# Patient Record
Sex: Female | Born: 1975 | Race: White | Hispanic: No | Marital: Married | Smoking: Never smoker
Health system: Southern US, Community
[De-identification: ages and names within clinical notes are randomized; demographics above are authoritative.]

## PROBLEM LIST (undated history)

## (undated) DIAGNOSIS — Z8742 Personal history of other diseases of the female genital tract: Secondary | ICD-10-CM

## (undated) DIAGNOSIS — E669 Obesity, unspecified: Secondary | ICD-10-CM

## (undated) DIAGNOSIS — E66811 Obesity, class 1: Secondary | ICD-10-CM

## (undated) DIAGNOSIS — Z8639 Personal history of other endocrine, nutritional and metabolic disease: Secondary | ICD-10-CM

## (undated) HISTORY — DX: Personal history of other diseases of the female genital tract: Z87.42

## (undated) HISTORY — DX: Obesity, class 1: E66.811

## (undated) HISTORY — DX: Personal history of other endocrine, nutritional and metabolic disease: Z86.39

## (undated) HISTORY — DX: Obesity, unspecified: E66.9

---

## 1998-05-09 ENCOUNTER — Other Ambulatory Visit: Admission: RE | Admit: 1998-05-09 | Discharge: 1998-05-09 | Payer: Self-pay | Admitting: Obstetrics and Gynecology

## 2002-12-23 ENCOUNTER — Emergency Department (HOSPITAL_COMMUNITY): Admission: EM | Admit: 2002-12-23 | Discharge: 2002-12-23 | Payer: Self-pay | Admitting: Emergency Medicine

## 2002-12-30 ENCOUNTER — Encounter: Admission: RE | Admit: 2002-12-30 | Discharge: 2002-12-30 | Payer: Self-pay | Admitting: Family Medicine

## 2002-12-30 ENCOUNTER — Encounter: Payer: Self-pay | Admitting: Family Medicine

## 2007-08-21 ENCOUNTER — Inpatient Hospital Stay (HOSPITAL_COMMUNITY): Admission: AD | Admit: 2007-08-21 | Discharge: 2007-08-21 | Payer: Self-pay | Admitting: *Deleted

## 2007-10-18 ENCOUNTER — Inpatient Hospital Stay (HOSPITAL_COMMUNITY): Admission: RE | Admit: 2007-10-18 | Discharge: 2007-10-20 | Payer: Self-pay | Admitting: Obstetrics and Gynecology

## 2011-03-11 NOTE — H&P (Signed)
NAMELOUIE, Cheyenne Gilbert                ACCOUNT NO.:  [id number]   MEDICAL RECORD NO.:  [id number]          PATIENT TYPE:  INP   LOCATION:  9168                          FACILITY:  [facility]   PHYSICIAN:  Lenoard Aden, M.D.DATE OF BIRTH:  [birth year]   DATE OF ADMISSION:  10/18/2007  DATE OF DISCHARGE:                              HISTORY & PHYSICAL   CHIEF COMPLAINT:  Mild preeclampsia and polyhydramnios, with favorable  cervix, for induction.   She is a 35 year old Caucasian female, G1 P0, at 37-5/7 weeks, with  elevated blood pressures, greater than 140/90, and more than 400 mg of  protein in the urine, and favorable cervix, with polyhydramnios, for  induction.  She reports within 24 hours new onset of occasional  scotomata and headache.   SURGICAL HISTORY:  Noncontributory.   MEDICATIONS:  Prenatal vitamins.   FAMILY HISTORY:  Diabetes.   She is a nonsmoker, nondrinker.   She denies domestic physical violence.   She has a history of wisdom tooth extraction and infertility.   Prenatal course has been complicated by elevated blood pressure and  proteinuria as noted.   Laboratory values have all been within normal limits.   PHYSICAL EXAMINATION:  GENERAL:  She is a well-developed, well-nourished  white female in no acute distress.  HEENT:  Normal.  LUNGS:  Clear.  HEART:  Regular rate and rhythm.  ABDOMEN:  Soft, gravid, nontender.  Estimated fetal weight approximately  7 pounds by ultrasound.  PELVIC:  Cervix is 3 cm, 80%, vertex -1.  EXTREMITIES:  Without clubbing, cyanosis, or edema.  NEUROLOGIC:  Nonfocal.  SKIN:  Intact.   IMPRESSION:  1. A 37-week intrauterine pregnancy.  2. Mild preeclampsia at term, with favorable cervix.  3. Polyhydramnios.   PLAN:  Proceed with induction.  GBS positive.  Will proceed with  penicillin prophylaxis.      Lenoard Aden, M.D.  Electronically Signed     RJT/MEDQ  D:  10/18/2007  T:  10/18/2007  Job:  [id number]

## 2011-08-01 LAB — CBC
HCT: 34.6 — ABNORMAL LOW
Hemoglobin: 10.3 — ABNORMAL LOW
Hemoglobin: 12.2
MCHC: 35
MCHC: 35.3
MCV: 92.2
Platelets: 194
Platelets: 255
RBC: 3.75 — ABNORMAL LOW
RDW: 13.8
RDW: 14.1
WBC: 9.9

## 2011-08-01 LAB — COMPREHENSIVE METABOLIC PANEL
ALT: 20
AST: 23
Albumin: 2.6 — ABNORMAL LOW
Alkaline Phosphatase: 135 — ABNORMAL HIGH
BUN: 5 — ABNORMAL LOW
CO2: 23
Calcium: 9
Chloride: 103
Creatinine, Ser: 0.57
GFR calc Af Amer: >60
GFR calc non Af Amer: >60
Glucose, Bld: 91
Potassium: 3.7
Sodium: 135
Total Bilirubin: 0.3
Total Protein: 6

## 2011-08-01 LAB — URIC ACID: Uric Acid, Serum: 4.2

## 2011-08-01 LAB — RPR: RPR Ser Ql: NONREACTIVE

## 2011-08-01 LAB — LACTATE DEHYDROGENASE: LDH: 118

## 2011-08-06 LAB — URINALYSIS, ROUTINE W REFLEX MICROSCOPIC
Hgb urine dipstick: NEGATIVE
Nitrite: NEGATIVE
Protein, ur: NEGATIVE
Specific Gravity, Urine: 1.01
Urobilinogen, UA: 0.2

## 2011-08-06 LAB — URINE CULTURE

## 2013-01-12 ENCOUNTER — Other Ambulatory Visit: Payer: Self-pay | Admitting: Obstetrics and Gynecology

## 2013-01-12 DIAGNOSIS — N6489 Other specified disorders of breast: Secondary | ICD-10-CM

## 2013-01-21 ENCOUNTER — Other Ambulatory Visit: Payer: Self-pay | Admitting: Obstetrics and Gynecology

## 2013-01-21 ENCOUNTER — Ambulatory Visit
Admission: RE | Admit: 2013-01-21 | Discharge: 2013-01-21 | Disposition: A | Discharge status: Home / Self Care | Payer: BC Managed Care – PPO | Source: Ambulatory Visit | Attending: Obstetrics and Gynecology | Admitting: Obstetrics and Gynecology

## 2013-01-21 DIAGNOSIS — N6489 Other specified disorders of breast: Secondary | ICD-10-CM

## 2013-08-11 ENCOUNTER — Other Ambulatory Visit: Payer: Self-pay | Admitting: Obstetrics and Gynecology

## 2013-08-11 DIAGNOSIS — N6489 Other specified disorders of breast: Secondary | ICD-10-CM

## 2013-08-18 ENCOUNTER — Ambulatory Visit
Admission: RE | Admit: 2013-08-18 | Discharge: 2013-08-18 | Disposition: A | Discharge status: Home / Self Care | Payer: BC Managed Care – PPO | Source: Ambulatory Visit | Attending: Obstetrics and Gynecology | Admitting: Obstetrics and Gynecology

## 2013-08-18 DIAGNOSIS — N6489 Other specified disorders of breast: Secondary | ICD-10-CM

## 2014-11-06 IMAGING — MG MM DIGITAL DIAGNOSTIC UNILAT*R*
2 series · 2 of 2 positions shown · non-contrast
Comparison: Prior exams

CLINICAL DATA: Followup right upper outer quadrant focal asymmetry

EXAM:
DIGITAL DIAGNOSTIC  right MAMMOGRAM with CAD

[R MLO]
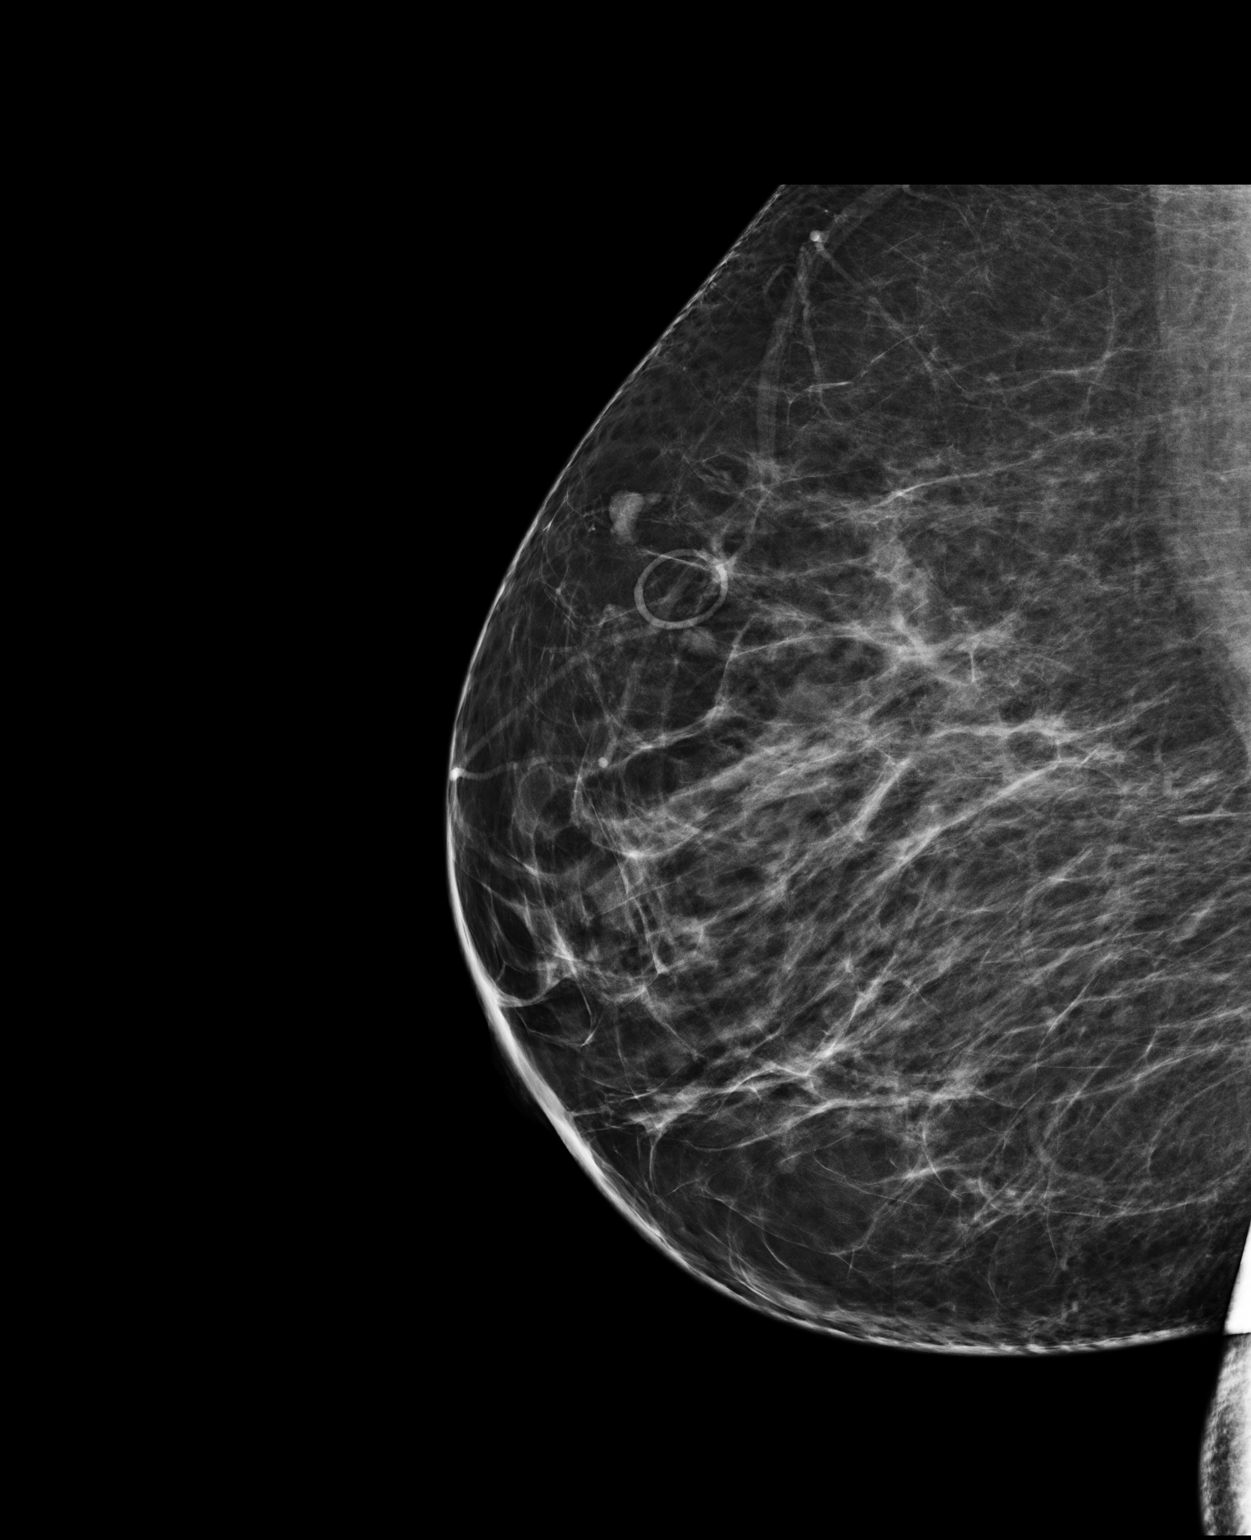

[R CC]
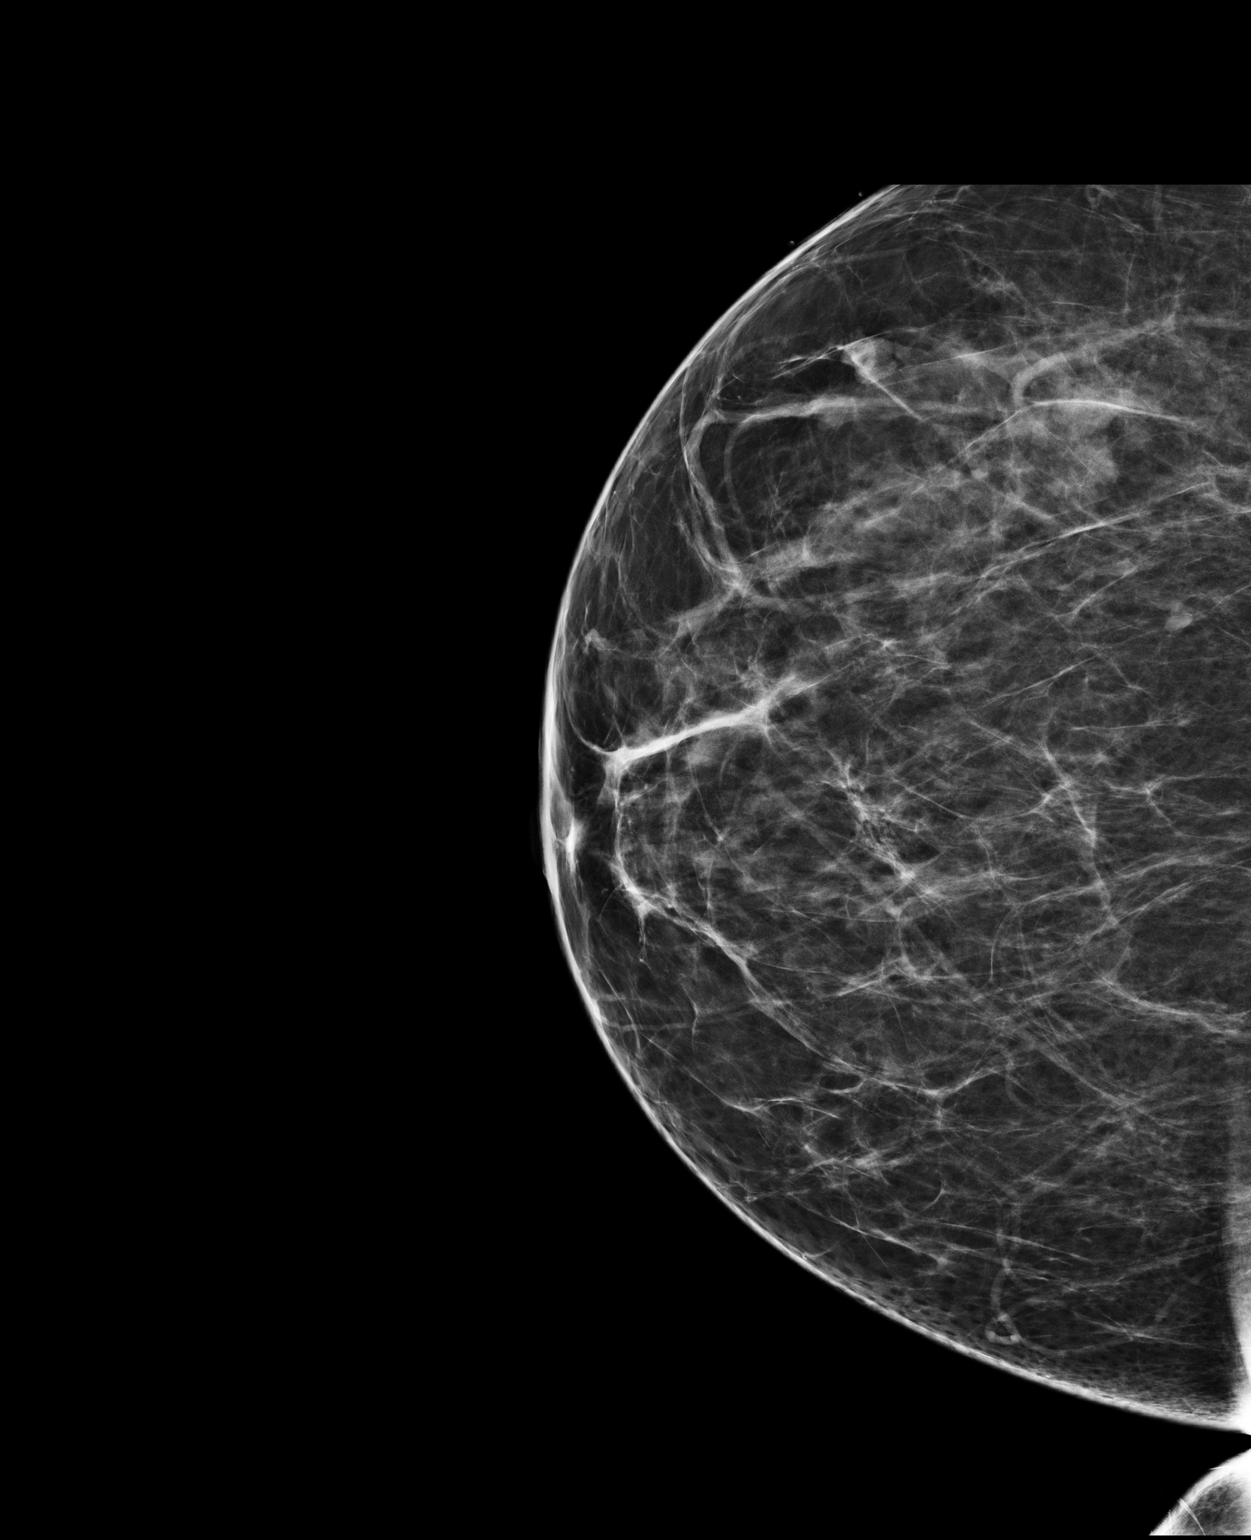

[2 of 2 positions shown; findings below may reference images not displayed]

ACR Breast Density Category b: There are scattered areas of
fibroglandular density.
FINDINGS: No suspicious finding is seen in the right breast. Normal appearing
right upper outer quadrant intramammary lymph nodes are identified.
Nodular breast parenchyma is reidentified in the right upper
quadrant without suspicious feature.

Mammographic images were processed with CAD.
IMPRESSION: No evidence for malignancy in the right breast.

RECOMMENDATION:
Screening mammogram at age 40 unless there are persistent or
intervening clinical concerns. (Code:[id number])

I have discussed the findings and recommendations with the patient.
Results were also provided in writing at the conclusion of the
visit. If applicable, a reminder letter will be sent to the patient
regarding the next appointment.

BI-RADS CATEGORY  1: Negative

## 2016-06-23 ENCOUNTER — Ambulatory Visit: Payer: Self-pay | Admitting: Family Medicine

## 2016-07-07 ENCOUNTER — Other Ambulatory Visit: Payer: Self-pay | Admitting: Family Medicine

## 2016-07-07 ENCOUNTER — Ambulatory Visit (INDEPENDENT_AMBULATORY_CARE_PROVIDER_SITE_OTHER): Payer: BLUE CROSS/BLUE SHIELD | Admitting: Family Medicine

## 2016-07-07 ENCOUNTER — Other Ambulatory Visit: Payer: BLUE CROSS/BLUE SHIELD

## 2016-07-07 ENCOUNTER — Encounter: Payer: Self-pay | Admitting: Family Medicine

## 2016-07-07 VITALS — BP 118/80 | HR 77 | Temp 98.4°F | Ht 64.5 in | Wt 233.6 lb

## 2016-07-07 DIAGNOSIS — I519 Heart disease, unspecified: Principal | ICD-10-CM

## 2016-07-07 DIAGNOSIS — Z8742 Personal history of other diseases of the female genital tract: Secondary | ICD-10-CM

## 2016-07-07 DIAGNOSIS — R946 Abnormal results of thyroid function studies: Secondary | ICD-10-CM

## 2016-07-07 DIAGNOSIS — F32A Depression, unspecified: Secondary | ICD-10-CM | POA: Insufficient documentation

## 2016-07-07 DIAGNOSIS — F329 Major depressive disorder, single episode, unspecified: Secondary | ICD-10-CM | POA: Diagnosis not present

## 2016-07-07 DIAGNOSIS — R7989 Other specified abnormal findings of blood chemistry: Secondary | ICD-10-CM

## 2016-07-07 DIAGNOSIS — Z0001 Encounter for general adult medical examination with abnormal findings: Secondary | ICD-10-CM

## 2016-07-07 DIAGNOSIS — R6889 Other general symptoms and signs: Secondary | ICD-10-CM

## 2016-07-07 DIAGNOSIS — E669 Obesity, unspecified: Secondary | ICD-10-CM | POA: Diagnosis not present

## 2016-07-07 DIAGNOSIS — E039 Hypothyroidism, unspecified: Secondary | ICD-10-CM

## 2016-07-07 LAB — POC URINALSYSI DIPSTICK (AUTOMATED)
Bilirubin, UA: NEGATIVE
Glucose, UA: NEGATIVE
KETONES UA: NEGATIVE
Leukocytes, UA: NEGATIVE
Nitrite, UA: NEGATIVE
PROTEIN UA: NEGATIVE
RBC UA: NEGATIVE
SPEC GRAV UA: 1.025
UROBILINOGEN UA: 0.2
pH, UA: 5

## 2016-07-07 LAB — CBC WITH DIFFERENTIAL/PLATELET
BASOS ABS: 0 10*3/uL (ref 0.0–0.1)
Basophils Relative: 0.4 % (ref 0.0–3.0)
EOS ABS: 0.2 10*3/uL (ref 0.0–0.7)
Eosinophils Relative: 2.4 % (ref 0.0–5.0)
HCT: 39.8 % (ref 36.0–46.0)
Hemoglobin: 13.6 g/dL (ref 12.0–15.0)
LYMPHS ABS: 2.3 10*3/uL (ref 0.7–4.0)
LYMPHS PCT: 33.8 % (ref 12.0–46.0)
MCHC: 34.2 g/dL (ref 30.0–36.0)
MCV: 86 fl (ref 78.0–100.0)
MONOS PCT: 6.4 % (ref 3.0–12.0)
Monocytes Absolute: 0.4 10*3/uL (ref 0.1–1.0)
NEUTROS PCT: 57 % (ref 43.0–77.0)
Neutro Abs: 3.9 10*3/uL (ref 1.4–7.7)
Platelets: 314 10*3/uL (ref 150.0–400.0)
RBC: 4.62 Mil/uL (ref 3.87–5.11)
RDW: 13.3 % (ref 11.5–15.5)
WBC: 6.9 10*3/uL (ref 4.0–10.5)

## 2016-07-07 LAB — HEPATIC FUNCTION PANEL
ALBUMIN: 4.5 g/dL (ref 3.5–5.2)
ALT: 59 U/L — AB (ref 0–35)
AST: 37 U/L (ref 0–37)
Alkaline Phosphatase: 81 U/L (ref 39–117)
Bilirubin, Direct: 0.1 mg/dL (ref 0.0–0.3)
TOTAL PROTEIN: 7 g/dL (ref 6.0–8.3)
Total Bilirubin: 0.4 mg/dL (ref 0.2–1.2)

## 2016-07-07 LAB — TSH: TSH: 5.8 u[IU]/mL — AB (ref 0.35–4.50)

## 2016-07-07 LAB — LIPID PANEL
CHOL/HDL RATIO: 2
Cholesterol: 164 mg/dL (ref 0–200)
HDL: 66.1 mg/dL (ref >39.00)
LDL CALC: 75 mg/dL (ref 0–99)
NonHDL: 97.47
TRIGLYCERIDES: 112 mg/dL (ref 0.0–149.0)
VLDL: 22.4 mg/dL (ref 0.0–40.0)

## 2016-07-07 LAB — BASIC METABOLIC PANEL
BUN: 12 mg/dL (ref 6–23)
CHLORIDE: 105 meq/L (ref 96–112)
CO2: 28 meq/L (ref 19–32)
CREATININE: 0.78 mg/dL (ref 0.40–1.20)
Calcium: 9 mg/dL (ref 8.4–10.5)
GFR: 86.9 mL/min (ref >60.00)
Glucose, Bld: 94 mg/dL (ref 70–99)
POTASSIUM: 4 meq/L (ref 3.5–5.1)
Sodium: 139 mEq/L (ref 135–145)

## 2016-07-07 MED ORDER — SERTRALINE HCL 50 MG PO TABS: 50.0000 mg | ORAL_TABLET | Freq: Every day | ORAL | 3 refills | Status: DC

## 2016-07-07 NOTE — Progress Notes (Signed)
..  lb

## 2016-07-07 NOTE — Progress Notes (Signed)
Patient ID: Cheyenne Gilbert, female   DOB: 1975/12/19, 40 y.o.   MRN: [id number]  Patient presents to clinic today to establish care and is new to the clinic.  Ms. Cheyenne Gilbert is a 40 year old female who is a nonsmoker and is here to to establish and seek routine care. She is concerned about her weight and reports a history of PCOS and irregular menstrual cycles. She is followed by gynecology for this care and is UTD. She reports a trial of Metformin related to PCOS many years ago however states that adverse effect of nausea was persistent and she discontinued use of this medication.  She is currently not using OCP and is not seeking pregnancy.   Diet is not monitored and she reports eating fast foods and is concerned about improving her diet. She is motivated to begin a heart healthy diet and increase her exercise regimen for weight loss. She reports a history of phentermine use which provided excellent benefit but is not interested in medication for weight loss at this time. Exercise is noted as walking on the treadmill for 2 to 3 times/week for 20 minutes without cardiopulmonary symptoms.   Depression is noted today. PHQ-9 noted as a 4. With anhedonia reported for 3 months. Symptoms are also associated with body image per patient report and PCOS. She denies suicidal ideation and plan and is also interested in counseling. No aggravating or alleviating factors noted and she reports a positive family support system at home.    Chronic Issues: PCOS:  Irregular cycles are noted. Patient reports that she may have only one menstrual cycle in a year and is currently sexually active with one partner and condom use.    Health Maintenance: Dental -- Every 6 months Vision -- Yearly; wears contacts Immunizations -- UTD 2009 per patient report  Colonoscopy --Not needed Mammogram --UTD 2017; followed by gynecology PAP -- UTD 2017; followed by gynecology     Review of Systems  Constitutional: Negative for chills  and weight loss.  HENT: Negative for congestion, ear pain, sore throat and tinnitus.   Eyes: Negative for pain and redness.  Respiratory: Negative for cough, hemoptysis and shortness of breath.   Cardiovascular: Negative for chest pain, palpitations and leg swelling.  Gastrointestinal: Negative for constipation, diarrhea, heartburn, nausea and vomiting.  Genitourinary: Negative for dysuria, frequency, hematuria and urgency.  Musculoskeletal: Negative for back pain, joint pain and myalgias.  Neurological: Negative for dizziness, tingling, weakness and headaches.  Psychiatric/Behavioral: Negative for depression. The patient is not nervous/anxious.        Denies depressed or anxious mood   Past Medical History:  Diagnosis Date  . History of PCOS      Social History   Social History  . Marital status: Married    Spouse name: N/A  . Number of children: N/A  . 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 Smoking status: Never Smoker  . Smokeless tobacco: Never Used  . Alcohol use No  . Drug use: No  . Sexual activity: Yes    Birth control/ protection: Condom   Other Topics Concern  . Not on file   Social History Narrative   Sons 4513 and 40 year old. Adopted 8939 year old from [place] and and 40 year old.      Goes by her middle name:  [name]    No past surgical history on file.  Family  History  Problem Relation Age of Onset  . Hypertension Father     No Known Allergies  No current outpatient prescriptions on file prior to visit.   No current facility-administered medications on file prior to visit.     BP 118/80 (BP Location: Right Arm, Patient Position: Sitting, Cuff Size: Normal)   Pulse 77   Temp 98.4 F (36.9 C) (Oral)   Ht 5' 4.5" (1.638 m)   Wt 233 lb 9.6 oz (106 kg)   LMP 06/05/2016 Comment: PCOS   SpO2 97%   Breastfeeding? Unknown   BMI 39.48 kg/m     Physical  Exam  Constitutional: She is oriented to person, place, and time. She appears well-developed and well-nourished. No distress.  HENT:  Head: Normocephalic and atraumatic.  Right Ear: Tympanic membrane and ear canal normal.  Left Ear: Tympanic membrane and ear canal normal.  Mouth/Throat: Oropharynx is clear and moist.  Eyes: Pupils are equal, round, and reactive to light. No scleral icterus.  Neck: Normal range of motion. No thyromegaly present.  Cardiovascular: Normal rate and regular rhythm.   No murmur heard. Pulmonary/Chest: Effort normal and breath sounds normal. No respiratory distress. He has no wheezes. She has no rales. She exhibits no tenderness.  Abdominal: Soft. Bowel sounds are normal. She exhibits no distension and no mass. There is no tenderness. There is no rebound and no guarding.  Musculoskeletal: She exhibits no edema.  Lymphadenopathy:    She has no cervical adenopathy.  Neurological: She is alert and oriented to person, place, and time. She has normal patellar reflexes. She exhibits normal muscle tone. Coordination normal.  Skin: Skin is warm and dry.  Psychiatric: She has a normal mood and affect. Her behavior is normal. Judgment and thought content normal. She has symptoms of mild depression with PHQ-9:  4. She denies any suicidal ideation or plan. Gynecologic exam deferred; followed by gynecology and visit is UTD. No history of abnormal PAP   Assessment/Plan:  40 y.o. female presenting for annual physical.  Health Maintenance counseling: 1. Anticipatory guidance: Patient counseled regarding regular dental exams, eye exams, wearing seatbelts.  2. Risk factor reduction:  Advised patient of need for regular exercise and diet rich and fruits and vegetables to reduce risk of heart attack and stroke.  3. Immunizations/screenings/ancillary studies: She reports UTD on Tdap which will be due in 2019.  Health Maintenance Due  Topic Date Due  . HIV Screening  06/07/1991   . TETANUS/TDAP  06/07/1995  . PAP SMEAR  06/06/1997  . INFLUENZA VACCINE  05/27/2016   4. Cervical cancer screening- UTD 2017; follows gynecology 5. Breast cancer screening-  and mammogram:  Complete 07/04/2016 6. Colon cancer screening - Not needed at this time 7. Skin cancer screening- Use of sunscreen recommended  1. Encounter for general adult medical examination with abnormal findings Encouraged heart healthy diet and exercise. See anticipatory guidance noted above. - CBC with Differential/Platelet - Basic metabolic panel - Lipid panel - Hepatic function panel - TSH - POCT UA - Glucose/Protein  2. Depression Discussed counseling options with trial of sertraline with patient. She voiced interest in counseling; written information with provider contact numbers given to patient today. Will trial sertraline; follow up; in one month. Advised patient that this medication may cause nausea at first and advised that she take this medication at night if this occurs. She voiced understanding and agreed with plan. - sertraline (ZOLOFT) 50 MG tablet; Take 1 tablet (50 mg total) by mouth daily.  Dispense: 30 tablet; Refill: 3  3. History of polycystic ovarian syndrome Follow up with gynecology as recommended.  Will review lab results.  Patient reports consideration of an endocrinology referral that she is discussing with her gynecologist.   5. Obesity Encouraged heart healthy diet and scheduled exercise program with patient. Reviewed long term health concerns with increased weight. She is motivated to begin dietary modifications and exercise.  Will consider referral to nutrition in the future.  Will follow up with influenza vaccine at next visit.  Roddie Mc, FNP-C

## 2016-07-07 NOTE — Patient Instructions (Signed)
It was a pleasure meeting you today! Please follow up in one month for evaluation of new medication.  We have ordered labs or studies at this visit. It can take up to 1-2 weeks for results and processing. IF results require follow up or explanation, we will call you with instructions. Clinically stable results will be released to your [facility]. If you have not heard from [place] or cannot find your results in [facility] in 2 weeks please contact our office at [phone].  If you are not yet signed up for [facility], please consider signing up   Health Maintenance, Female Adopting a healthy lifestyle and getting preventive care can go a long way to promote health and wellness. Talk with your health care provider about what schedule of regular examinations is right for you. This is a good chance for you to check in with your provider about disease prevention and staying healthy. In between checkups, there are plenty of things you can do on your own. Experts have done a lot of research about which lifestyle changes and preventive measures are most likely to keep you healthy. Ask your health care provider for more information. WEIGHT AND DIET  Eat a healthy diet  Be sure to include plenty of vegetables, fruits, low-fat dairy products, and lean protein.  Do not eat a lot of foods high in solid fats, added sugars, or salt.  Get regular exercise. This is one of the most important things you can do for your health.  Most adults should exercise for at least 150 minutes each week. The exercise should increase your heart rate and make you sweat (moderate-intensity exercise).  Most adults should also do strengthening exercises at least twice a week. This is in addition to the moderate-intensity exercise.  Maintain a healthy weight  Body mass index (BMI) is a measurement that can be used to identify possible weight problems. It estimates body fat based on height and weight. Your health care provider can help  determine your BMI and help you achieve or maintain a healthy weight.  For females 11 years of age and older:   A BMI below 18.5 is considered underweight.  A BMI of 18.5 to 24.9 is normal.  A BMI of 25 to 29.9 is considered overweight.  A BMI of 30 and above is considered obese.  Watch levels of cholesterol and blood lipids  You should start having your blood tested for lipids and cholesterol at 40 years of age, then have this test every 5 years.  You may need to have your cholesterol levels checked more often if:  Your lipid or cholesterol levels are high.  You are older than 40 years of age.  You are at high risk for heart disease.  CANCER SCREENING   Lung Cancer  Lung cancer screening is recommended for adults 5-70 years old who are at high risk for lung cancer because of a history of smoking.  A yearly low-dose CT scan of the lungs is recommended for people who:  Currently smoke.  Have quit within the past 15 years.  Have at least a 30-pack-year history of smoking. A pack year is smoking an average of one pack of cigarettes a day for 1 year.  Yearly screening should continue until it has been 15 years since you quit.  Yearly screening should stop if you develop a health problem that would prevent you from having lung cancer treatment.  Breast Cancer  Practice breast self-awareness. This means understanding how your breasts  normally appear and feel.  It also means doing regular breast self-exams. Let your health care provider know about any changes, no matter how small.  If you are in your 20s or 30s, you should have a clinical breast exam (CBE) by a health care provider every 1-3 years as part of a regular health exam.  If you are 70 or older, have a CBE every year. Also consider having a breast X-ray (mammogram) every year.  If you have a family history of breast cancer, talk to your health care provider about genetic screening.  If you are at high risk  for breast cancer, talk to your health care provider about having an MRI and a mammogram every year.  Breast cancer gene (BRCA) assessment is recommended for women who have family members with BRCA-related cancers. BRCA-related cancers include:  Breast.  Ovarian.  Tubal.  Peritoneal cancers.  Results of the assessment will determine the need for genetic counseling and BRCA1 and BRCA2 testing. Cervical Cancer Your health care provider may recommend that you be screened regularly for cancer of the pelvic organs (ovaries, uterus, and vagina). This screening involves a pelvic examination, including checking for microscopic changes to the surface of your cervix (Pap test). You may be encouraged to have this screening done every 3 years, beginning at age 71.  For women ages 37-65, health care providers may recommend pelvic exams and Pap testing every 3 years, or they may recommend the Pap and pelvic exam, combined with testing for human papilloma virus (HPV), every 5 years. Some types of HPV increase your risk of cervical cancer. Testing for HPV may also be done on women of any age with unclear Pap test results.  Other health care providers may not recommend any screening for nonpregnant women who are considered low risk for pelvic cancer and who do not have symptoms. Ask your health care provider if a screening pelvic exam is right for you.  If you have had past treatment for cervical cancer or a condition that could lead to cancer, you need Pap tests and screening for cancer for at least 20 years after your treatment. If Pap tests have been discontinued, your risk factors (such as having a new sexual partner) need to be reassessed to determine if screening should resume. Some women have medical problems that increase the chance of getting cervical cancer. In these cases, your health care provider may recommend more frequent screening and Pap tests. Colorectal Cancer  This type of cancer can be  detected and often prevented.  Routine colorectal cancer screening usually begins at 40 years of age and continues through 40 years of age.  Your health care provider may recommend screening at an earlier age if you have risk factors for colon cancer.  Your health care provider may also recommend using home test kits to check for hidden blood in the stool.  A small camera at the end of a tube can be used to examine your colon directly (sigmoidoscopy or colonoscopy). This is done to check for the earliest forms of colorectal cancer.  Routine screening usually begins at age 80.  Direct examination of the colon should be repeated every 5-10 years through 40 years of age. However, you may need to be screened more often if early forms of precancerous polyps or small growths are found. Skin Cancer  Check your skin from head to toe regularly.  Tell your health care provider about any new moles or changes in moles, especially if there  is a change in a mole's shape or color.  Also tell your health care provider if you have a mole that is larger than the size of a pencil eraser.  Always use sunscreen. Apply sunscreen liberally and repeatedly throughout the day.  Protect yourself by wearing long sleeves, pants, a wide-brimmed hat, and sunglasses whenever you are outside. HEART DISEASE, DIABETES, AND HIGH BLOOD PRESSURE   High blood pressure causes heart disease and increases the risk of stroke. High blood pressure is more likely to develop in:  People who have blood pressure in the high end of the normal range (130-139/85-89 mm Hg).  People who are overweight or obese.  People who are African American.  If you are 2-71 years of age, have your blood pressure checked every 3-5 years. If you are 43 years of age or older, have your blood pressure checked every year. You should have your blood pressure measured twice--once when you are at a hospital or clinic, and once when you are not at a  hospital or clinic. Record the average of the two measurements. To check your blood pressure when you are not at a hospital or clinic, you can use:  An automated blood pressure machine at a pharmacy.  A home blood pressure monitor.  If you are between 80 years and 57 years old, ask your health care provider if you should take aspirin to prevent strokes.  Have regular diabetes screenings. This involves taking a blood sample to check your fasting blood sugar level.  If you are at a normal weight and have a low risk for diabetes, have this test once every three years after 40 years of age.  If you are overweight and have a high risk for diabetes, consider being tested at a younger age or more often. PREVENTING INFECTION  Hepatitis B  If you have a higher risk for hepatitis B, you should be screened for this virus. You are considered at high risk for hepatitis B if:  You were born in a country where hepatitis B is common. Ask your health care provider which countries are considered high risk.  Your parents were born in a high-risk country, and you have not been immunized against hepatitis B (hepatitis B vaccine).  You have HIV or AIDS.  You use needles to inject street drugs.  You live with someone who has hepatitis B.  You have had sex with someone who has hepatitis B.  You get hemodialysis treatment.  You take certain medicines for conditions, including cancer, organ transplantation, and autoimmune conditions. Hepatitis C  Blood testing is recommended for:  Everyone born from [birth year] through 1965.  Anyone with known risk factors for hepatitis C. Sexually transmitted infections (STIs)  You should be screened for sexually transmitted infections (STIs) including gonorrhea and chlamydia if:  You are sexually active and are younger than 40 years of age.  You are older than 40 years of age and your health care provider tells you that you are at risk for this type of  infection.  Your sexual activity has changed since you were last screened and you are at an increased risk for chlamydia or gonorrhea. Ask your health care provider if you are at risk.  If you do not have HIV, but are at risk, it may be recommended that you take a prescription medicine daily to prevent HIV infection. This is called pre-exposure prophylaxis (PrEP). You are considered at risk if:  You are sexually active and do not  regularly use condoms or know the HIV status of your partner(s).  You take drugs by injection.  You are sexually active with a partner who has HIV. Talk with your health care provider about whether you are at high risk of being infected with HIV. If you choose to begin PrEP, you should first be tested for HIV. You should then be tested every 3 months for as long as you are taking PrEP.  PREGNANCY   If you are premenopausal and you may become pregnant, ask your health care provider about preconception counseling.  If you may become pregnant, take 400 to 800 micrograms (mcg) of folic acid every day.  If you want to prevent pregnancy, talk to your health care provider about birth control (contraception). OSTEOPOROSIS AND MENOPAUSE   Osteoporosis is a disease in which the bones lose minerals and strength with aging. This can result in serious bone fractures. Your risk for osteoporosis can be identified using a bone density scan.  If you are 23 years of age or older, or if you are at risk for osteoporosis and fractures, ask your health care provider if you should be screened.  Ask your health care provider whether you should take a calcium or vitamin D supplement to lower your risk for osteoporosis.  Menopause may have certain physical symptoms and risks.  Hormone replacement therapy may reduce some of these symptoms and risks. Talk to your health care provider about whether hormone replacement therapy is right for you.  HOME CARE INSTRUCTIONS   Schedule regular  health, dental, and eye exams.  Stay current with your immunizations.   Do not use any tobacco products including cigarettes, chewing tobacco, or electronic cigarettes.  If you are pregnant, do not drink alcohol.  If you are breastfeeding, limit how much and how often you drink alcohol.  Limit alcohol intake to no more than 1 drink per day for nonpregnant women. One drink equals 12 ounces of beer, 5 ounces of wine, or 1 ounces of hard liquor.  Do not use street drugs.  Do not share needles.  Ask your health care provider for help if you need support or information about quitting drugs.  Tell your health care provider if you often feel depressed.  Tell your health care provider if you have ever been abused or do not feel safe at home.   This information is not intended to replace advice given to you by your health care provider. Make sure you discuss any questions you have with your health care provider.   Document Released: 04/28/2011 Document Revised: 11/03/2014 Document Reviewed: 09/14/2013 Elsevier Interactive Patient Education [employer].

## 2016-07-09 NOTE — Addendum Note (Signed)
Addended by: Bonnye FavaKWEI, NANA K on: 07/09/2016 02:55 PM   Modules accepted: Orders

## 2016-07-10 ENCOUNTER — Other Ambulatory Visit (INDEPENDENT_AMBULATORY_CARE_PROVIDER_SITE_OTHER): Payer: BLUE CROSS/BLUE SHIELD

## 2016-07-10 DIAGNOSIS — R7989 Other specified abnormal findings of blood chemistry: Secondary | ICD-10-CM

## 2016-07-10 DIAGNOSIS — R946 Abnormal results of thyroid function studies: Secondary | ICD-10-CM

## 2016-07-10 LAB — T4, FREE: Free T4: 0.79 ng/dL (ref 0.60–1.60)

## 2016-07-10 NOTE — Addendum Note (Signed)
Addended by: Bonnye FavaKWEI, NANA K on: 07/10/2016 09:20 AM   Modules accepted: Orders

## 2016-07-11 ENCOUNTER — Encounter: Payer: Self-pay | Admitting: Family Medicine

## 2016-08-07 ENCOUNTER — Ambulatory Visit: Payer: BLUE CROSS/BLUE SHIELD | Admitting: Family Medicine

## 2016-10-02 DIAGNOSIS — E039 Hypothyroidism, unspecified: Secondary | ICD-10-CM | POA: Diagnosis not present

## 2017-06-11 ENCOUNTER — Encounter: Payer: Self-pay | Admitting: Family Medicine

## 2017-06-11 ENCOUNTER — Ambulatory Visit (INDEPENDENT_AMBULATORY_CARE_PROVIDER_SITE_OTHER): Payer: BLUE CROSS/BLUE SHIELD | Admitting: Family Medicine

## 2017-06-11 VITALS — BP 100/64 | HR 90 | Temp 98.4°F | Ht 64.5 in | Wt 219.5 lb

## 2017-06-11 DIAGNOSIS — M9904 Segmental and somatic dysfunction of sacral region: Secondary | ICD-10-CM

## 2017-06-11 DIAGNOSIS — M9903 Segmental and somatic dysfunction of lumbar region: Secondary | ICD-10-CM | POA: Diagnosis not present

## 2017-06-11 DIAGNOSIS — M545 Low back pain, unspecified: Secondary | ICD-10-CM

## 2017-06-11 MED ORDER — CYCLOBENZAPRINE HCL 5 MG PO TABS: 5.0000 mg | ORAL_TABLET | Freq: Every evening | ORAL | 0 refills | Status: DC | PRN

## 2017-06-11 MED ORDER — NAPROXEN 500 MG PO TABS: 500.0000 mg | ORAL_TABLET | Freq: Two times a day (BID) | ORAL | 0 refills | Status: DC | PRN

## 2017-06-11 NOTE — Patient Instructions (Signed)
BEFORE YOU LEAVE: -follow up: 1 month -low back exercises  Do the exercises 4 days per week.  Heat (rice sock), tiger balm (menthol) and naproxen as needed for pain.  Muscle relaxer at night if needed.  I hope you are feeling better soon! Follow up sooner if worsening or new concerns.

## 2017-06-11 NOTE — Progress Notes (Signed)
HPI:  Acute visit for low back pain: -Started suddenly 2 days ago -She did run on some roller coasters a few days prior -Pain is in the low back, is sharp with certain movements, dull and constant otherwise -Was worse after sitting on the bleachers for several hours -Denies: Radiation of pain, weakness, numbness, fevers, malaise, bowel or bladder dysfunction -History of tailbone issues, but denies history of back pains otherwise -First day last menstrual period at least several months ago, reports history of PCO S and rarely has periods  ROS: See pertinent positives and negatives per HPI.  Past Medical History:  Diagnosis Date  . History of PCOS     No past surgical history on file.  Family History  Problem Relation Age of Onset  . Hypertension Father     Social History   Social History  . Marital status: Married    Spouse name: N/A  . Number of children: N/A  . 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 Smoking status: Never Smoker  . Smokeless tobacco: Never Used  . Alcohol use No  . Drug use: No  . Sexual activity: Yes    Birth control/ protection: Condom   Other Topics Concern  . None   Social History Narrative   Sons 3813 and 41 year old. Adopted 41 year old from [place] and and 41 year old.      Goes by her middle name:  Cheyenne Gilbert     Current Outpatient Prescriptions:  .  SYNTHROID 50 MCG tablet, , Disp: , Rfl:  .  cyclobenzaprine (FLEXERIL) 5 MG tablet, Take 1 tablet (5 mg total) by mouth at bedtime as needed for muscle spasms., Disp: 20 tablet, Rfl: 0 .  naproxen (NAPROSYN) 500 MG tablet, Take 1 tablet (500 mg total) by mouth 2 (two) times daily as needed for moderate pain., Disp: 20 tablet, Rfl: 0  EXAM:  Vitals:   06/11/17 1553  BP: 100/64  Pulse: 90  Temp: 98.4 F (36.9 C)    Body mass index is 37.1 kg/m.  GENERAL: vitals reviewed and listed above, alert, oriented,  appears well hydrated and in no acute distress  HEENT: atraumatic, conjunttiva clear, no obvious abnormalities on inspection of external nose and ears  NECK: no obvious masses on inspection  LUNGS: clear to auscultation bilaterally, no wheezes, rales or rhonchi, good air movement  CV: HRRR, no peripheral edema  MS/Osteopathic: moves all extremities without noticeable abnormality Normal Gait Normal inspection of back, no obvious scoliosis or leg length descrepancy No bony TTP Soft tissue TTP at: Right lumbar paraspinal muscles around L3-L5, left lower lumbar paraspinal muscles and over pedicles of L4 She has muscle tension bilaterally in the lower lumbar region, L1-3  rotated right side bent left; L4 extended rotated left side bent left Right innominate anterior Tight hamstrings -/+ tests: neg trendelenburg,+facet loading L L4, -SLRT, -CLRT, -FABER, -FADIR Normal muscle strength, sensation to light touch and DTRs in LEs bilaterally  PSYCH: pleasant and cooperative, no obvious depression or anxiety  ASSESSMENT AND PLAN:  Discussed the following assessment and plan:  Acute low back pain without sciatica, unspecified back pain laterality  Somatic dysfunction of spine, sacral  Somatic dysfunction of spine, lumbar  -we discussed possible serious and likely etiologies, workup and treatment, treatment risks and return precautions - she does have somatic dysfunction on exam, also suspect muscle strain and facet arthropathy most likely -  No significant alarm features or focal neuro deficits -after this discussion, Cheyenne Gilbert opted for osteopathic medical treatment with ME, BMT, verbal consent obtained, tolerated well, home exercises, symptomatic care with heat, topical menthol, over-the-counter analgesics and Flexeril as needed after discussion of risks -follow up advised in 3-4 weeks -of course, we advised Cheyenne Gilbert  to return or notify a doctor immediately if symptoms worsen or persist or new  concerns arise.   Patient Instructions  BEFORE YOU LEAVE: -follow up: 1 month -low back exercises  Do the exercises 4 days per week.  Heat (rice sock), tiger balm (menthol) and naproxen as needed for pain.  Muscle relaxer at night if needed.  I hope you are feeling better soon! Follow up sooner if worsening or new concerns.     Cheyenne Basque R., DO

## 2017-06-15 ENCOUNTER — Encounter: Payer: Self-pay | Admitting: Family Medicine

## 2017-06-16 ENCOUNTER — Encounter: Payer: Self-pay | Admitting: *Deleted

## 2017-06-18 DIAGNOSIS — Z130 Encounter for screening for diseases of the blood and blood-forming organs and certain disorders involving the immune mechanism: Secondary | ICD-10-CM | POA: Diagnosis not present

## 2017-06-18 DIAGNOSIS — E282 Polycystic ovarian syndrome: Secondary | ICD-10-CM | POA: Diagnosis not present

## 2017-06-18 DIAGNOSIS — Z3200 Encounter for pregnancy test, result unknown: Secondary | ICD-10-CM | POA: Diagnosis not present

## 2017-06-18 DIAGNOSIS — N938 Other specified abnormal uterine and vaginal bleeding: Secondary | ICD-10-CM | POA: Diagnosis not present

## 2017-06-18 DIAGNOSIS — N926 Irregular menstruation, unspecified: Secondary | ICD-10-CM | POA: Diagnosis not present

## 2017-06-25 DIAGNOSIS — N938 Other specified abnormal uterine and vaginal bleeding: Secondary | ICD-10-CM | POA: Diagnosis not present

## 2017-06-25 DIAGNOSIS — R938 Abnormal findings on diagnostic imaging of other specified body structures: Secondary | ICD-10-CM | POA: Diagnosis not present

## 2017-06-25 DIAGNOSIS — E282 Polycystic ovarian syndrome: Secondary | ICD-10-CM | POA: Diagnosis not present

## 2017-07-02 DIAGNOSIS — Z30430 Encounter for insertion of intrauterine contraceptive device: Secondary | ICD-10-CM | POA: Diagnosis not present

## 2017-07-02 DIAGNOSIS — N854 Malposition of uterus: Secondary | ICD-10-CM | POA: Diagnosis not present

## 2017-07-16 ENCOUNTER — Encounter: Payer: Self-pay | Admitting: Family Medicine

## 2017-08-13 DIAGNOSIS — Z30431 Encounter for routine checking of intrauterine contraceptive device: Secondary | ICD-10-CM | POA: Diagnosis not present

## 2018-03-25 DIAGNOSIS — D1801 Hemangioma of skin and subcutaneous tissue: Secondary | ICD-10-CM | POA: Diagnosis not present

## 2018-03-25 DIAGNOSIS — D2339 Other benign neoplasm of skin of other parts of face: Secondary | ICD-10-CM | POA: Diagnosis not present

## 2018-11-05 ENCOUNTER — Encounter: Payer: BLUE CROSS/BLUE SHIELD | Admitting: Family Medicine

## 2019-08-22 ENCOUNTER — Other Ambulatory Visit: Payer: Self-pay

## 2019-12-07 DIAGNOSIS — Z1231 Encounter for screening mammogram for malignant neoplasm of breast: Secondary | ICD-10-CM | POA: Diagnosis not present

## 2019-12-08 DIAGNOSIS — L718 Other rosacea: Secondary | ICD-10-CM | POA: Diagnosis not present

## 2019-12-20 DIAGNOSIS — Z6834 Body mass index (BMI) 34.0-34.9, adult: Secondary | ICD-10-CM | POA: Diagnosis not present

## 2019-12-20 DIAGNOSIS — Z1151 Encounter for screening for human papillomavirus (HPV): Secondary | ICD-10-CM | POA: Diagnosis not present

## 2019-12-20 DIAGNOSIS — Z01419 Encounter for gynecological examination (general) (routine) without abnormal findings: Secondary | ICD-10-CM | POA: Diagnosis not present

## 2020-01-18 DIAGNOSIS — L718 Other rosacea: Secondary | ICD-10-CM | POA: Diagnosis not present

## 2020-01-19 DIAGNOSIS — M7062 Trochanteric bursitis, left hip: Secondary | ICD-10-CM | POA: Diagnosis not present

## 2020-01-19 DIAGNOSIS — M25552 Pain in left hip: Secondary | ICD-10-CM | POA: Diagnosis not present

## 2020-02-10 ENCOUNTER — Other Ambulatory Visit: Payer: Self-pay | Admitting: Family Medicine

## 2020-02-10 ENCOUNTER — Ambulatory Visit (INDEPENDENT_AMBULATORY_CARE_PROVIDER_SITE_OTHER): Payer: BLUE CROSS/BLUE SHIELD | Admitting: Family Medicine

## 2020-02-10 ENCOUNTER — Encounter: Payer: Self-pay | Admitting: Family Medicine

## 2020-02-10 DIAGNOSIS — Z13220 Encounter for screening for lipoid disorders: Secondary | ICD-10-CM | POA: Diagnosis not present

## 2020-02-10 DIAGNOSIS — R5383 Other fatigue: Secondary | ICD-10-CM

## 2020-02-10 DIAGNOSIS — E039 Hypothyroidism, unspecified: Secondary | ICD-10-CM

## 2020-02-10 DIAGNOSIS — Z131 Encounter for screening for diabetes mellitus: Secondary | ICD-10-CM

## 2020-02-10 MED ORDER — SOOLANTRA 1 % EX CREA: 1.0000 "application " | TOPICAL_CREAM | Freq: Two times a day (BID) | CUTANEOUS | Status: DC

## 2020-02-10 MED ORDER — SULFACETAMIDE SODIUM-SULFUR 8-4 % EX SUSP: 1.0000 "application " | Freq: Every day | CUTANEOUS | Status: AC

## 2020-02-10 NOTE — Progress Notes (Signed)
Virtual Visit via Video Note  I connected with Cheyenne Gilbert on 02/10/20 at  8:30 AM EDT by a video enabled telemedicine application and verified that I am speaking with the correct person using two identifiers.  Location patient: home Location provider:work office Persons participating in the virtual visit: patient, provider  I discussed the limitations of evaluation and management by telemedicine and the availability of in person appointments. The patient expressed understanding and agreed to proceed.   Cheyenne Gilbert DOB: 22-Oct-1976 Encounter date: 02/10/2020  This is a 44 y.o. female who presents to establish care. Chief Complaint  Patient presents with  . Establish Care    History of present illness: Not great with keeping up with regular visits.   Gyn:dr. Ronita Hipps Dermatologist: [facility]; was seeing them for rosacea. Doxycycline made her sick. She is now just using topical treatments. Gets annual screening through [facility] yearly. (gets finger stick)  Had mammogram in last couple of months.   Was put on low dose of thyroid med a few years ago and just kind of stopped taking this. Was working really hard at weight loss and got down to 193, and lost weight during COVID, but has gained back a lot in last few months. Feels like she is gaining more than she should for what she is eating.   PCOS: went through fertility tx in 2004. Couldn't even do in vitro due to pcos. 4 years later got pregnant without trying. Periods have never been normal.   Past Medical History:  Diagnosis Date  . History of PCOS    History reviewed. No pertinent surgical history. No Known Allergies Current Meds  Medication Sig  . levonorgestrel (MIRENA) 20 MCG/24HR IUD 1 each by Intrauterine route once. Inserted 05/2017   Social History   Tobacco Use  . Smoking status: Never Smoker  . Smokeless tobacco: Never Used  Substance Use Topics  . Alcohol use: Yes    Comment: occasionally   Family History   Problem Relation Age of Onset  . Healthy Mother   . Hypertension Father   . Other Father        borderline diabetes  . Obesity Brother   . Stomach cancer Maternal Grandmother   . Cirrhosis Maternal Grandfather   . Alcohol abuse Maternal Grandfather   . Stroke Paternal Grandfather 65  . Diabetes Paternal Grandfather   . Cirrhosis Maternal Aunt        no alcohol  . Diabetes Paternal Uncle      Review of Systems  Constitutional: Positive for fatigue. Negative for chills and fever.  Respiratory: Negative for cough, chest tightness, shortness of breath and wheezing.   Cardiovascular: Negative for chest pain, palpitations and leg swelling.    Objective:  There were no vitals taken for this visit.      BP Readings from Last 3 Encounters:  06/11/17 100/64  07/07/16 118/80   Wt Readings from Last 3 Encounters:  06/11/17 219 lb 8 oz (99.6 kg)  07/07/16 233 lb 9.6 oz (106 kg)    EXAM:  GENERAL: alert, oriented, appears well and in no acute distress  HEENT: atraumatic, conjunctiva clear, no obvious abnormalities on inspection of external nose and ears  NECK: normal movements of the head and neck  LUNGS: on inspection no signs of respiratory distress, breathing rate appears normal, no obvious gross SOB, gasping or wheezing  CV: no obvious cyanosis  MS: moves all visible extremities without noticeable abnormality  PSYCH/NEURO: pleasant and cooperative, no obvious depression  or anxiety, speech and thought processing grossly intact   Assessment/Plan  1. Lipid screening  - Lipid panel; Future  2. Screening for diabetes mellitus  - Comprehensive metabolic panel; Future - Hemoglobin A1c; Future  3. Hypothyroidism, unspecified type Previously on medication; will recheck labs due to fatigue, weight gain - TSH; Future - T4, free; Future - T3, free; Future  4. Fatigue, unspecified type - CBC with Differential/Platelet; Future - Comprehensive metabolic panel;  Future - VITAMIN D 25 Hydroxy (Vit-D Deficiency, Fractures); Future - Vitamin B12; Future   Return for bloodwork.   I discussed the assessment and treatment plan with the patient. The patient was provided an opportunity to ask questions and all were answered. The patient agreed with the plan and demonstrated an understanding of the instructions.   The patient was advised to call back or seek an in-person evaluation if the symptoms worsen or if the condition fails to improve as anticipated.  I provided 30 minutes of non-face-to-face time during this encounter.   Theodis Shove, MD

## 2020-02-14 ENCOUNTER — Telehealth: Payer: Self-pay | Admitting: *Deleted

## 2020-02-14 NOTE — Telephone Encounter (Signed)
Spoke with the pt and scheduled appts as below.  

## 2020-02-14 NOTE — Telephone Encounter (Signed)
-----   Message from Wynn Banker, MD sent at 02/10/2020  9:02 AM EDT ----- Please set up lab visit and she would like to get Tdap booster at same time (nurse visit)

## 2020-02-21 ENCOUNTER — Other Ambulatory Visit: Payer: Self-pay

## 2020-02-22 ENCOUNTER — Other Ambulatory Visit (INDEPENDENT_AMBULATORY_CARE_PROVIDER_SITE_OTHER): Payer: BC Managed Care – PPO

## 2020-02-22 ENCOUNTER — Ambulatory Visit (INDEPENDENT_AMBULATORY_CARE_PROVIDER_SITE_OTHER): Payer: BC Managed Care – PPO

## 2020-02-22 DIAGNOSIS — Z13220 Encounter for screening for lipoid disorders: Secondary | ICD-10-CM

## 2020-02-22 DIAGNOSIS — Z131 Encounter for screening for diabetes mellitus: Secondary | ICD-10-CM

## 2020-02-22 DIAGNOSIS — Z23 Encounter for immunization: Secondary | ICD-10-CM | POA: Diagnosis not present

## 2020-02-22 DIAGNOSIS — E039 Hypothyroidism, unspecified: Secondary | ICD-10-CM | POA: Diagnosis not present

## 2020-02-22 DIAGNOSIS — R5383 Other fatigue: Secondary | ICD-10-CM | POA: Diagnosis not present

## 2020-02-22 LAB — COMPREHENSIVE METABOLIC PANEL
ALT: 22 U/L (ref 0–35)
AST: 16 U/L (ref 0–37)
Albumin: 4.4 g/dL (ref 3.5–5.2)
Alkaline Phosphatase: 64 U/L (ref 39–117)
BUN: 13 mg/dL (ref 6–23)
CO2: 26 mEq/L (ref 19–32)
Calcium: 9.2 mg/dL (ref 8.4–10.5)
Chloride: 106 mEq/L (ref 96–112)
Creatinine, Ser: 0.68 mg/dL (ref 0.40–1.20)
GFR: 94.12 mL/min (ref >60.00)
Glucose, Bld: 97 mg/dL (ref 70–99)
Potassium: 4 mEq/L (ref 3.5–5.1)
Sodium: 140 mEq/L (ref 135–145)
Total Bilirubin: 0.5 mg/dL (ref 0.2–1.2)
Total Protein: 6.4 g/dL (ref 6.0–8.3)

## 2020-02-22 LAB — T3, FREE: T3, Free: 3.5 pg/mL (ref 2.3–4.2)

## 2020-02-22 LAB — CBC WITH DIFFERENTIAL/PLATELET
Basophils Absolute: 0.1 10*3/uL (ref 0.0–0.1)
Basophils Relative: 1.2 % (ref 0.0–3.0)
Eosinophils Absolute: 0.2 10*3/uL (ref 0.0–0.7)
Eosinophils Relative: 2.8 % (ref 0.0–5.0)
HCT: 38.6 % (ref 36.0–46.0)
Hemoglobin: 13.2 g/dL (ref 12.0–15.0)
Lymphocytes Relative: 25 % (ref 12.0–46.0)
Lymphs Abs: 1.7 10*3/uL (ref 0.7–4.0)
MCHC: 34.1 g/dL (ref 30.0–36.0)
MCV: 87.7 fl (ref 78.0–100.0)
Monocytes Absolute: 0.4 10*3/uL (ref 0.1–1.0)
Monocytes Relative: 6.2 % (ref 3.0–12.0)
Neutro Abs: 4.3 10*3/uL (ref 1.4–7.7)
Neutrophils Relative %: 64.8 % (ref 43.0–77.0)
Platelets: 305 10*3/uL (ref 150.0–400.0)
RBC: 4.4 Mil/uL (ref 3.87–5.11)
RDW: 12.7 % (ref 11.5–15.5)
WBC: 6.6 10*3/uL (ref 4.0–10.5)

## 2020-02-22 LAB — LIPID PANEL
Cholesterol: 157 mg/dL (ref 0–200)
HDL: 57.3 mg/dL (ref >39.00)
LDL Cholesterol: 82 mg/dL (ref 0–99)
NonHDL: 100.03
Total CHOL/HDL Ratio: 3
Triglycerides: 92 mg/dL (ref 0.0–149.0)
VLDL: 18.4 mg/dL (ref 0.0–40.0)

## 2020-02-22 LAB — T4, FREE: Free T4: 0.72 ng/dL (ref 0.60–1.60)

## 2020-02-22 LAB — TSH: TSH: 3.09 u[IU]/mL (ref 0.35–4.50)

## 2020-02-22 LAB — HEMOGLOBIN A1C: Hgb A1c MFr Bld: 5.2 % (ref 4.6–6.5)

## 2020-02-22 LAB — VITAMIN B12: Vitamin B-12: 264 pg/mL (ref 211–911)

## 2020-02-22 LAB — VITAMIN D 25 HYDROXY (VIT D DEFICIENCY, FRACTURES): VITD: 33.61 ng/mL (ref 30.00–100.00)

## 2020-02-22 NOTE — Progress Notes (Signed)
Per orders of Dr. Hassan Rowan, injection of Tdap given by Sherrin Daisy. Patient tolerated injection well.

## 2020-06-25 ENCOUNTER — Encounter: Payer: Self-pay | Admitting: Family Medicine

## 2020-06-25 DIAGNOSIS — U071 COVID-19: Secondary | ICD-10-CM | POA: Diagnosis not present

## 2020-06-26 ENCOUNTER — Telehealth (INDEPENDENT_AMBULATORY_CARE_PROVIDER_SITE_OTHER): Payer: BC Managed Care – PPO | Admitting: Family Medicine

## 2020-06-26 ENCOUNTER — Encounter: Payer: Self-pay | Admitting: Family Medicine

## 2020-06-26 VITALS — Temp 99.6°F | Ht 65.0 in | Wt 220.0 lb

## 2020-06-26 DIAGNOSIS — B342 Coronavirus infection, unspecified: Secondary | ICD-10-CM | POA: Diagnosis not present

## 2020-06-26 DIAGNOSIS — R059 Cough, unspecified: Secondary | ICD-10-CM

## 2020-06-26 DIAGNOSIS — E669 Obesity, unspecified: Secondary | ICD-10-CM

## 2020-06-26 DIAGNOSIS — R05 Cough: Secondary | ICD-10-CM

## 2020-06-26 MED ORDER — BENZONATATE 100 MG PO CAPS
100.0000 mg | ORAL_CAPSULE | Freq: Three times a day (TID) | ORAL | 0 refills | Status: DC | PRN
Start: 2020-06-26 — End: 2021-07-15

## 2020-06-26 NOTE — Patient Instructions (Signed)
-  stay home while sick and for a full 10 days since onset of symptoms PLUS one day of no fever and feeling better when you have COVID19  -I sent the medication(s) we discussed to your pharmacy: Meds ordered this encounter  Medications  . benzonatate (TESSALON PERLES) 100 MG capsule    Sig: Take 1 capsule (100 mg total) by mouth 3 (three) times daily as needed.    Dispense:  20 capsule    Refill:  0    -call number provided for outpatient COVID19 treatment center [phone]  -can use tylenol or aleve if needed for fevers, aches and pains per instructions  -can use nasal saline a few times per day if nasal congestion, sometime a short course of Afrin nasal spray for 3 days can help as well  -stay hydrated, drink plenty of fluids and eat small healthy meals - avoid dairy  -can take 1000 IU Vit D3 and Vit C lozenges per instructions  -check out the CDC website for more information  -follow up with your doctor in 2-3 days unless improving and feeling better  I hope you are feeling better soon! Seek in-person care or a follow up telemedicine visit promptly if your symptoms worsen, new concerns arise or you are not improving as expected. Call 911 if severe symptoms.  

## 2020-06-26 NOTE — Telephone Encounter (Signed)
Spoke with the Cheyenne Gilbert and scheduled a virtual appt for today with Dr Selena Batten at 3pm.

## 2020-06-26 NOTE — Progress Notes (Signed)
Virtual Visit via Video Note  I connected with Cheyenne Gilbert  on 06/26/20 at  3:00 PM EDT by a video enabled telemedicine application and verified that I am speaking with the correct person using two identifiers.  Location patient: home, Cheyenne Gilbert Location provider:work or home office Persons participating in the virtual visit: patient, provider  I discussed the limitations of evaluation and management by telemedicine and the availability of in person appointments. The patient expressed understanding and agreed to proceed.   HPI:  Acute visit for Cough/COVID19: -started 8/28 -had positive COVID19 test 06/15/2020 -symptoms: tickle in throat, sinus congestion, cough, chills at times - low grade fever -denies fevers, SOB, NVD, body aches, inability to get out of bed, CP, worst headache or severe headache,  -not vaccinated for covid19 -no known exposures, kids go to school, husband works -has IUD  -denies any underlying health issues  ROS: See pertinent positives and negatives per HPI.  Past Medical History:  Diagnosis Date  . History of PCOS     History reviewed. No pertinent surgical history.  Family History  Problem Relation Age of Onset  . Healthy Mother   . Hypertension Father   . Other Father        borderline diabetes  . Obesity Brother   . Stomach cancer Maternal Grandmother   . Cirrhosis Maternal Grandfather   . Alcohol abuse Maternal Grandfather   . Stroke Paternal Grandfather 58  . Diabetes Paternal Grandfather   . Cirrhosis Maternal Aunt        no alcohol  . Diabetes Paternal Uncle     SOCIAL HX: see hpi   Current Outpatient Medications:  .  Ivermectin (SOOLANTRA) 1 % CREA, Apply 1 application topically 2 (two) times daily., Disp: , Rfl:  .  levonorgestrel (MIRENA) 20 MCG/24HR IUD, 1 each by Intrauterine route once. Inserted 05/2017, Disp: , Rfl:  .  Sulfacetamide Sodium-Sulfur (SULFACLEANSE 8/4) 8-4 % SUSP, Apply 1 application topically daily., Disp: , Rfl:  .   benzonatate (TESSALON PERLES) 100 MG capsule, Take 1 capsule (100 mg total) by mouth 3 (three) times daily as needed., Disp: 20 capsule, Rfl: 0  EXAM:  VITALS per patient if applicable: Vitals:   06/26/20 1506  Temp: 99.6 F (37.6 C)   Body mass index is 36.61 kg/m.   GENERAL: alert, oriented, appears well and in no acute distress  HEENT: atraumatic, conjunttiva clear, no obvious abnormalities on inspection of external nose and ears  NECK: normal movements of the head and neck  LUNGS: on inspection no signs of respiratory distress, breathing rate appears normal, no obvious gross SOB, gasping or wheezing  CV: no obvious cyanosis  MS: moves all visible extremities without noticeable abnormality  PSYCH/NEURO: pleasant and cooperative, no obvious depression or anxiety, speech and thought processing grossly intact  ASSESSMENT AND PLAN:  Discussed the following assessment and plan:  Cough  Coronavirus infection  Obesity (BMI 30-39.9)  -we discussed possible serious and likely etiologies, options for evaluation and workup, limitations of telemedicine visit vs in person visit, treatment, treatment risks and precautions. Pt prefers to treat via telemedicine empirically rather then risking or undertaking an in person visit at this moment. Discussed implications, treatment options, potential complications, risks factors, home isolation and precautions for COVID19 infection. Opted for vit D and C, tessalon rx for cough, prn otc meds for symptoms as needed. Discussed MAB, number provided - she does not feel like she needs this. Agrees to home isolation. Declines scheduled follow up but agrees  to seek care if needed. Work/School slipped offered: declined Advised to seek prompt follow up telemedicine visit or in person care if worsening, new symptoms arise, or if is not improving as expected.   I discussed the assessment and treatment plan with the patient. The patient was provided an  opportunity to ask questions and all were answered. The patient agreed with the plan and demonstrated an understanding of the instructions.   The patient was advised to call back or seek an in-person evaluation if the symptoms worsen or if the condition fails to improve as anticipated.   Terressa Koyanagi, DO

## 2021-06-19 DIAGNOSIS — Z1231 Encounter for screening mammogram for malignant neoplasm of breast: Secondary | ICD-10-CM | POA: Diagnosis not present

## 2021-06-19 DIAGNOSIS — Z6837 Body mass index (BMI) 37.0-37.9, adult: Secondary | ICD-10-CM | POA: Diagnosis not present

## 2021-06-19 DIAGNOSIS — Z30431 Encounter for routine checking of intrauterine contraceptive device: Secondary | ICD-10-CM | POA: Diagnosis not present

## 2021-06-19 DIAGNOSIS — Z01419 Encounter for gynecological examination (general) (routine) without abnormal findings: Secondary | ICD-10-CM | POA: Diagnosis not present

## 2021-07-15 ENCOUNTER — Encounter: Payer: Self-pay | Admitting: Family Medicine

## 2021-07-15 ENCOUNTER — Encounter: Payer: Self-pay | Admitting: Gastroenterology

## 2021-07-15 ENCOUNTER — Other Ambulatory Visit: Payer: Self-pay

## 2021-07-15 ENCOUNTER — Ambulatory Visit (INDEPENDENT_AMBULATORY_CARE_PROVIDER_SITE_OTHER): Payer: BC Managed Care – PPO | Admitting: Family Medicine

## 2021-07-15 VITALS — BP 118/82 | HR 66 | Temp 98.4°F | Ht 64.5 in | Wt 222.6 lb

## 2021-07-15 DIAGNOSIS — Z1211 Encounter for screening for malignant neoplasm of colon: Secondary | ICD-10-CM

## 2021-07-15 DIAGNOSIS — Z0000 Encounter for general adult medical examination without abnormal findings: Secondary | ICD-10-CM | POA: Diagnosis not present

## 2021-07-15 DIAGNOSIS — Z13220 Encounter for screening for lipoid disorders: Secondary | ICD-10-CM

## 2021-07-15 DIAGNOSIS — Z8639 Personal history of other endocrine, nutritional and metabolic disease: Secondary | ICD-10-CM | POA: Diagnosis not present

## 2021-07-15 DIAGNOSIS — Z131 Encounter for screening for diabetes mellitus: Secondary | ICD-10-CM | POA: Diagnosis not present

## 2021-07-15 DIAGNOSIS — Z8742 Personal history of other diseases of the female genital tract: Secondary | ICD-10-CM | POA: Diagnosis not present

## 2021-07-15 LAB — COMPREHENSIVE METABOLIC PANEL
ALT: 23 U/L (ref 0–35)
AST: 17 U/L (ref 0–37)
Albumin: 4.4 g/dL (ref 3.5–5.2)
Alkaline Phosphatase: 63 U/L (ref 39–117)
BUN: 9 mg/dL (ref 6–23)
CO2: 28 mEq/L (ref 19–32)
Calcium: 9.2 mg/dL (ref 8.4–10.5)
Chloride: 105 mEq/L (ref 96–112)
Creatinine, Ser: 0.74 mg/dL (ref 0.40–1.20)
GFR: 97.93 mL/min (ref >60.00)
Glucose, Bld: 80 mg/dL (ref 70–99)
Potassium: 4 mEq/L (ref 3.5–5.1)
Sodium: 139 mEq/L (ref 135–145)
Total Bilirubin: 0.5 mg/dL (ref 0.2–1.2)
Total Protein: 7.1 g/dL (ref 6.0–8.3)

## 2021-07-15 LAB — LIPID PANEL
Cholesterol: 154 mg/dL (ref 0–200)
HDL: 67.1 mg/dL (ref >39.00)
LDL Cholesterol: 63 mg/dL (ref 0–99)
NonHDL: 86.43
Total CHOL/HDL Ratio: 2
Triglycerides: 118 mg/dL (ref 0.0–149.0)
VLDL: 23.6 mg/dL (ref 0.0–40.0)

## 2021-07-15 LAB — CBC WITH DIFFERENTIAL/PLATELET
Basophils Absolute: 0 10*3/uL (ref 0.0–0.1)
Basophils Relative: 0.7 % (ref 0.0–3.0)
Eosinophils Absolute: 0.1 10*3/uL (ref 0.0–0.7)
Eosinophils Relative: 1.5 % (ref 0.0–5.0)
HCT: 37.5 % (ref 36.0–46.0)
Hemoglobin: 12.8 g/dL (ref 12.0–15.0)
Lymphocytes Relative: 34 % (ref 12.0–46.0)
Lymphs Abs: 2.3 10*3/uL (ref 0.7–4.0)
MCHC: 34.1 g/dL (ref 30.0–36.0)
MCV: 87.3 fl (ref 78.0–100.0)
Monocytes Absolute: 0.5 10*3/uL (ref 0.1–1.0)
Monocytes Relative: 7 % (ref 3.0–12.0)
Neutro Abs: 3.9 10*3/uL (ref 1.4–7.7)
Neutrophils Relative %: 56.8 % (ref 43.0–77.0)
Platelets: 278 10*3/uL (ref 150.0–400.0)
RBC: 4.29 Mil/uL (ref 3.87–5.11)
RDW: 13 % (ref 11.5–15.5)
WBC: 6.9 10*3/uL (ref 4.0–10.5)

## 2021-07-15 LAB — T3, FREE: T3, Free: 3.2 pg/mL (ref 2.3–4.2)

## 2021-07-15 LAB — TSH: TSH: 2.99 u[IU]/mL (ref 0.35–5.50)

## 2021-07-15 LAB — HEMOGLOBIN A1C: Hgb A1c MFr Bld: 5.3 % (ref 4.6–6.5)

## 2021-07-15 LAB — T4, FREE: Free T4: 0.64 ng/dL (ref 0.60–1.60)

## 2021-07-15 MED ORDER — METFORMIN HCL 500 MG PO TABS: 500.0000 mg | ORAL_TABLET | Freq: Two times a day (BID) | ORAL | 5 refills | Status: DC

## 2021-07-15 NOTE — Progress Notes (Signed)
Cheyenne Gilbert DOB: 06-10-1976 Encounter date: 07/15/2021  This is a 45 y.o. female who presents for complete physical   History of present illness/Additional concerns: Last visit with me was virtual on 02/10/20 to establish care.   Only concern is weight today. Late last year she got on track with exercise and eating healthier and had lost 30lbs. From April to now she has gained most of it back. Feels like if she doesn't starve self she is gaining. She is riding bike 2-3 times/week. Usually rides for 30 minutes.   Was doing boiled eggs in morning, chicken salad and crackers for lunch, apple with pb2, fish/vegetables/minimal rice. Drinks water only.   Couple of months ago was waking up at night really hot with neck sweating.   Never had regular cycles with pcos.   Gyn: [facility] - had mammogram at [facility] which was normal in August.  [facility] for skin care/rosacea.  Sees dentist regularly.   Past Medical History:  Diagnosis Date   History of PCOS    History reviewed. No pertinent surgical history. No Known Allergies Current Meds  Medication Sig   Ivermectin (SOOLANTRA) 1 % CREA Apply 1 application topically 2 (two) times daily.   levonorgestrel (MIRENA) 20 MCG/24HR IUD 1 each by Intrauterine route once. Inserted 05/2017   metFORMIN (GLUCOPHAGE) 500 MG tablet Take 1 tablet (500 mg total) by mouth 2 (two) times daily with a meal.   Sulfacetamide Sodium-Sulfur (SULFACLEANSE 8/4) 8-4 % SUSP Apply 1 application topically daily.   Social History   Tobacco Use   Smoking status: Never   Smokeless tobacco: Never  Substance Use Topics   Alcohol use: Yes    Comment: occasionally   Family History  Problem Relation Age of Onset   Healthy Mother    Hypertension Father    Other Father        borderline diabetes   Obesity Brother    Stomach cancer Maternal Grandmother    Cirrhosis Maternal Grandfather    Alcohol abuse Maternal Grandfather    Stroke Paternal Grandfather 77    Diabetes Paternal Grandfather    Cirrhosis Maternal Aunt        no alcohol   Diabetes Paternal Uncle      Review of Systems  Constitutional:  Negative for activity change, appetite change, chills, fatigue, fever and unexpected weight change.  HENT:  Negative for congestion, ear pain, hearing loss, sinus pressure, sinus pain, sore throat and trouble swallowing.   Eyes:  Negative for pain and visual disturbance.  Respiratory:  Negative for cough, chest tightness, shortness of breath and wheezing.   Cardiovascular:  Negative for chest pain, palpitations and leg swelling.  Gastrointestinal:  Negative for abdominal pain, blood in stool, constipation, diarrhea, nausea and vomiting.  Genitourinary:  Negative for difficulty urinating and menstrual problem.  Musculoskeletal:  Negative for arthralgias and back pain.  Skin:  Negative for rash.  Neurological:  Negative for dizziness, weakness, numbness and headaches.  Hematological:  Negative for adenopathy. Does not bruise/bleed easily.  Psychiatric/Behavioral:  Negative for sleep disturbance and suicidal ideas. The patient is not nervous/anxious.    CBC:  Lab Results  Component Value Date   WBC 6.6 02/22/2020   HGB 13.2 02/22/2020   HCT 38.6 02/22/2020   MCHC 34.1 02/22/2020   RDW 12.7 02/22/2020   PLT 305.0 02/22/2020   CMP: Lab Results  Component Value Date   NA 140 02/22/2020   K 4.0 02/22/2020   CL 106 02/22/2020  CO2 26 02/22/2020   GLUCOSE 97 02/22/2020   BUN 13 02/22/2020   CREATININE 0.68 02/22/2020   GFRAA  10/18/2007    >60        The eGFR has been calculated using the MDRD equation. This calculation has not been validated in all clinical   CALCIUM 9.2 02/22/2020   PROT 6.4 02/22/2020   BILITOT 0.5 02/22/2020   ALKPHOS 64 02/22/2020   ALT 22 02/22/2020   AST 16 02/22/2020   LIPID: Lab Results  Component Value Date   CHOL 157 02/22/2020   TRIG 92.0 02/22/2020   HDL 57.30 02/22/2020   LDLCALC 82 02/22/2020     Objective:  BP 118/82 (BP Location: Left Arm, Patient Position: Sitting, Cuff Size: Large)   Pulse 66   Temp 98.4 F (36.9 C) (Oral)   Ht 5' 4.5" (1.638 m)   Wt 222 lb 9.6 oz (101 kg)   SpO2 97%   BMI 37.62 kg/m   Weight: 222 lb 9.6 oz (101 kg)   BP Readings from Last 3 Encounters:  07/15/21 118/82  06/11/17 100/64  07/07/16 118/80   Wt Readings from Last 3 Encounters:  07/15/21 222 lb 9.6 oz (101 kg)  06/26/20 220 lb (99.8 kg)  06/11/17 219 lb 8 oz (99.6 kg)    Physical Exam Constitutional:      General: She is not in acute distress.    Appearance: She is well-developed.  HENT:     Head: Normocephalic and atraumatic.     Right Ear: External ear normal.     Left Ear: External ear normal.     Mouth/Throat:     Pharynx: No oropharyngeal exudate.  Eyes:     Conjunctiva/sclera: Conjunctivae normal.     Pupils: Pupils are equal, round, and reactive to light.  Neck:     Thyroid: No thyromegaly.  Cardiovascular:     Rate and Rhythm: Normal rate and regular rhythm.     Heart sounds: Normal heart sounds. No murmur heard.   No friction rub. No gallop.  Pulmonary:     Effort: Pulmonary effort is normal.     Breath sounds: Normal breath sounds.  Abdominal:     General: Bowel sounds are normal. There is no distension.     Palpations: Abdomen is soft. There is no mass.     Tenderness: There is no abdominal tenderness. There is no guarding.     Hernia: No hernia is present.  Musculoskeletal:        General: No tenderness or deformity. Normal range of motion.     Cervical back: Normal range of motion and neck supple.  Lymphadenopathy:     Cervical: No cervical adenopathy.  Skin:    General: Skin is warm and dry.     Findings: No rash.  Neurological:     Mental Status: She is alert and oriented to person, place, and time.     Deep Tendon Reflexes: Reflexes normal.     Reflex Scores:      Tricep reflexes are 2+ on the right side and 2+ on the left side.      Bicep  reflexes are 2+ on the right side and 2+ on the left side.      Brachioradialis reflexes are 2+ on the right side and 2+ on the left side.      Patellar reflexes are 2+ on the right side and 2+ on the left side. Psychiatric:        Speech:  Speech normal.        Behavior: Behavior normal.        Thought Content: Thought content normal.    Assessment/Plan: Health Maintenance Due  Topic Date Due   Hepatitis C Screening  Never done   COLONOSCOPY (Pts 45-50yr Insurance coverage will need to be confirmed)  Never done   Health Maintenance reviewed - up to date.   1. Preventative health care She will get flu shot done with family. She is up to date with preventative care.   2. History of hypothyroidism Recheck thyroid labs.  - CBC with Differential/Platelet; Future - T3, free; Future - T4, free; Future - TSH; Future - TSH - T4, free - T3, free - CBC with Differential/Platelet  3. History of PCOS Adding metformin to help with insulin resistance. She is interested in tools that help with weight loss; but also lifestyle changes that she can maintain rather than extremes. Consider nutritionist as well to help with dietary guidance.  - metFORMIN (GLUCOPHAGE) 500 MG tablet; Take 1 tablet (500 mg total) by mouth 2 (two) times daily with a meal.  Dispense: 60 tablet; Refill: 5  4. Lipid screening - Lipid panel; Future - Lipid panel  5. Screening for diabetes mellitus - Comprehensive metabolic panel; Future - Hemoglobin A1c; Future - Hemoglobin A1c - Comprehensive metabolic panel  6. Screening for colon cancer - Ambulatory referral to Gastroenterology  Return in about 6 months (around 01/12/2022) for Chronic condition visit.  JMicheline Rough MD

## 2021-08-20 ENCOUNTER — Encounter: Payer: Self-pay | Admitting: Family Medicine

## 2021-08-21 ENCOUNTER — Encounter: Payer: Self-pay | Admitting: Family Medicine

## 2021-08-24 NOTE — Telephone Encounter (Signed)
Yes, we have a couple of samples of Saxenda in the fridge on my side of the office. There is also a coupon for commercial paying patients at this link: https://www.[employer].com/saxenda/savings-card.html?src=000870101.

## 2021-09-04 NOTE — Progress Notes (Signed)
Please remind patient to call back GI and reschedule colonoscopy.  I sent does notice that she canceled appointment and did not reschedule.

## 2021-09-05 ENCOUNTER — Telehealth: Payer: Self-pay | Admitting: *Deleted

## 2021-09-05 NOTE — Telephone Encounter (Signed)
I left a detailed message at the patents cell number with the information below and advised she call [facility] at [phone] for an appt.

## 2021-09-05 NOTE — Telephone Encounter (Signed)
-----   Message from Wynn Banker, MD sent at 09/04/2021  1:24 PM EST ----- Regarding: FW: Bulk patient communication    ----- Message ----- From: Karna Christmas D Sent: 08/20/2021  12:10 PM EST To: Wynn Banker, MD Subject: Bulk patient communication

## 2021-09-09 ENCOUNTER — Encounter: Payer: BC Managed Care – PPO | Admitting: Gastroenterology

## 2021-12-29 ENCOUNTER — Other Ambulatory Visit: Payer: Self-pay | Admitting: Family Medicine

## 2021-12-29 DIAGNOSIS — Z8742 Personal history of other diseases of the female genital tract: Secondary | ICD-10-CM

## 2022-01-13 ENCOUNTER — Ambulatory Visit: Payer: BC Managed Care – PPO | Admitting: Family Medicine

## 2022-01-13 ENCOUNTER — Encounter: Payer: Self-pay | Admitting: Family Medicine

## 2022-01-13 VITALS — BP 80/52 | HR 90 | Temp 98.2°F | Ht 64.25 in | Wt 181.5 lb

## 2022-01-13 DIAGNOSIS — Z6830 Body mass index (BMI) 30.0-30.9, adult: Secondary | ICD-10-CM

## 2022-01-13 DIAGNOSIS — E282 Polycystic ovarian syndrome: Secondary | ICD-10-CM

## 2022-01-13 DIAGNOSIS — E669 Obesity, unspecified: Secondary | ICD-10-CM

## 2022-01-13 MED ORDER — TIRZEPATIDE 7.5 MG/0.5ML ~~LOC~~ SOAJ: 7.5000 mg | SUBCUTANEOUS | Status: AC

## 2022-01-13 NOTE — Progress Notes (Signed)
?Cheyenne Gilbert ?DOB: 02/06/1976 ?Encounter date: 01/13/2022 ? ?This is a 46 y.o. female who presents with ?Chief Complaint  ?Patient presents with  ? Follow-up  ? ? ?History of present illness: ?Last visit with me was 07/15/2021 for complete physical.  At that time she was worried about difficulties with trying to lose weight. ? ?History of PCOS: We added metformin 500 mg twice daily at her last visit. Started mounjara first of November- has lost 40 pounds with this. She has kept up with healthy eating and regular exercise.  She is doing telehealth through [facility] and getting med through them. Using [employer] card and getting this for $25. So many of pcos sx have gotten better with the weight loss. Skin is better. Hormonal skin changes - red, inflammation is better. Hair on chin is much better than prior to med. Has done ok with the metformin this time.  ? ?Can feel a little light headed in the morning, first thing out of bed.  ? ? ?No Known Allergies ?Current Meds  ?Medication Sig  ? levonorgestrel (MIRENA) 20 MCG/24HR IUD 1 each by Intrauterine route once. Inserted 05/2017  ? metFORMIN (GLUCOPHAGE) 500 MG tablet TAKE 1 TABLET(500 MG) BY MOUTH TWICE DAILY WITH A MEAL  ? Sulfacetamide Sodium-Sulfur (SULFACLEANSE 8/4) 8-4 % SUSP Apply 1 application topically daily.  ? ? ?Review of Systems  ?Constitutional:  Negative for chills, fatigue and fever.  ?Respiratory:  Negative for cough, chest tightness, shortness of breath and wheezing.   ?Cardiovascular:  Negative for chest pain, palpitations and leg swelling.  ? ?Objective: ? ?BP (!) 80/52 (BP Location: Left Arm, Patient Position: Sitting, Cuff Size: Normal)   Pulse 90   Temp 98.2 ?F (36.8 ?C) (Oral)   Ht 5' 4.25" (1.632 m)   Wt 181 lb 8 oz (82.3 kg)   SpO2 98%   BMI 30.91 kg/m?   Weight: 181 lb 8 oz (82.3 kg)  ? ?BP Readings from Last 3 Encounters:  ?01/13/22 (!) 80/52  ?07/15/21 118/82  ?06/11/17 100/64  ? ?Wt Readings from Last 3 Encounters:  ?01/13/22 181 lb 8 oz  (82.3 kg)  ?07/15/21 222 lb 9.6 oz (101 kg)  ?06/26/20 220 lb (99.8 kg)  ? ? ?Physical Exam ?Constitutional:   ?   General: She is not in acute distress. ?   Appearance: She is well-developed.  ?Cardiovascular:  ?   Rate and Rhythm: Normal rate and regular rhythm.  ?   Heart sounds: Normal heart sounds. No murmur heard. ?  No friction rub.  ?Pulmonary:  ?   Effort: Pulmonary effort is normal. No respiratory distress.  ?   Breath sounds: Normal breath sounds. No wheezing or rales.  ?Musculoskeletal:  ?   Right lower leg: No edema.  ?   Left lower leg: No edema.  ?Neurological:  ?   Mental Status: She is alert and oriented to person, place, and time.  ?Psychiatric:     ?   Behavior: Behavior normal.  ? ? ?Assessment/Plan ? ?1. PCOS (polycystic ovarian syndrome) ?She prefers to continue with metformin. Sx are all better after having weight loss.  ? ?2. Class 1 obesity without serious comorbidity with body mass index (BMI) of 30.0 to 30.9 in adult, unspecified obesity type ?She is doing great with weight loss. She has been still losing about 1lb/week on the 7.5mg  mounjaro. I do think that it would benefit her to lose a little more weight - thinking BMI below 30, but she is eating  healthy and staying active.  ? ?Return in about 6 months (around 07/16/2022) for physical exam. ? ? ? ? ?Micheline Rough, MD ?

## 2022-06-25 DIAGNOSIS — R3915 Urgency of urination: Secondary | ICD-10-CM | POA: Diagnosis not present

## 2022-06-25 DIAGNOSIS — Z118 Encounter for screening for other infectious and parasitic diseases: Secondary | ICD-10-CM | POA: Diagnosis not present

## 2022-06-25 DIAGNOSIS — R350 Frequency of micturition: Secondary | ICD-10-CM | POA: Diagnosis not present

## 2022-08-05 DIAGNOSIS — R3915 Urgency of urination: Secondary | ICD-10-CM | POA: Diagnosis not present

## 2022-08-11 DIAGNOSIS — L814 Other melanin hyperpigmentation: Secondary | ICD-10-CM | POA: Diagnosis not present

## 2022-08-11 DIAGNOSIS — D485 Neoplasm of uncertain behavior of skin: Secondary | ICD-10-CM | POA: Diagnosis not present

## 2022-08-15 DIAGNOSIS — N390 Urinary tract infection, site not specified: Secondary | ICD-10-CM | POA: Diagnosis not present

## 2022-08-26 DIAGNOSIS — Z6824 Body mass index (BMI) 24.0-24.9, adult: Secondary | ICD-10-CM | POA: Diagnosis not present

## 2022-08-26 DIAGNOSIS — Z01419 Encounter for gynecological examination (general) (routine) without abnormal findings: Secondary | ICD-10-CM | POA: Diagnosis not present

## 2022-08-26 DIAGNOSIS — Z1231 Encounter for screening mammogram for malignant neoplasm of breast: Secondary | ICD-10-CM | POA: Diagnosis not present

## 2022-08-29 DIAGNOSIS — N3000 Acute cystitis without hematuria: Secondary | ICD-10-CM | POA: Diagnosis not present

## 2022-08-29 DIAGNOSIS — R8271 Bacteriuria: Secondary | ICD-10-CM | POA: Diagnosis not present

## 2023-01-20 DIAGNOSIS — M25561 Pain in right knee: Secondary | ICD-10-CM | POA: Diagnosis not present

## 2023-04-16 ENCOUNTER — Ambulatory Visit: Payer: BC Managed Care – PPO | Admitting: Family Medicine

## 2023-04-23 ENCOUNTER — Ambulatory Visit: Payer: BC Managed Care – PPO | Admitting: Family Medicine

## 2023-04-28 ENCOUNTER — Other Ambulatory Visit: Payer: Self-pay

## 2023-05-05 ENCOUNTER — Encounter: Payer: Self-pay | Admitting: Family Medicine

## 2023-05-11 ENCOUNTER — Ambulatory Visit: Payer: BC Managed Care – PPO | Admitting: Family Medicine

## 2023-07-09 DIAGNOSIS — L718 Other rosacea: Secondary | ICD-10-CM | POA: Diagnosis not present

## 2023-11-24 DIAGNOSIS — Z01419 Encounter for gynecological examination (general) (routine) without abnormal findings: Secondary | ICD-10-CM | POA: Diagnosis not present

## 2023-11-24 DIAGNOSIS — R87810 Cervical high risk human papillomavirus (HPV) DNA test positive: Secondary | ICD-10-CM | POA: Diagnosis not present

## 2023-11-24 DIAGNOSIS — Z124 Encounter for screening for malignant neoplasm of cervix: Secondary | ICD-10-CM | POA: Diagnosis not present

## 2023-11-24 DIAGNOSIS — Z1231 Encounter for screening mammogram for malignant neoplasm of breast: Secondary | ICD-10-CM | POA: Diagnosis not present

## 2023-12-06 DIAGNOSIS — Z1211 Encounter for screening for malignant neoplasm of colon: Secondary | ICD-10-CM | POA: Diagnosis not present

## 2023-12-06 DIAGNOSIS — Z1212 Encounter for screening for malignant neoplasm of rectum: Secondary | ICD-10-CM | POA: Diagnosis not present

## 2023-12-13 LAB — COLOGUARD: COLOGUARD: NEGATIVE

## 2023-12-13 LAB — EXTERNAL GENERIC LAB PROCEDURE: COLOGUARD: NEGATIVE
